# Patient Record
Sex: Female | Born: 1975 | Race: White | Hispanic: No | Marital: Married
Health system: Southern US, Community
[De-identification: ages and names within clinical notes are randomized; demographics above are authoritative.]

---

## 2005-02-16 ENCOUNTER — Other Ambulatory Visit: Admission: RE | Admit: 2005-02-16 | Discharge: 2005-02-16 | Payer: Self-pay | Admitting: Obstetrics & Gynecology

## 2006-12-22 ENCOUNTER — Encounter: Admission: RE | Admit: 2006-12-22 | Discharge: 2007-02-26 | Payer: Self-pay | Admitting: Obstetrics & Gynecology

## 2007-02-27 ENCOUNTER — Inpatient Hospital Stay (HOSPITAL_COMMUNITY): Admission: AD | Admit: 2007-02-27 | Discharge: 2007-02-28 | Payer: Self-pay | Admitting: Obstetrics & Gynecology

## 2014-03-01 ENCOUNTER — Other Ambulatory Visit: Payer: Self-pay | Admitting: Obstetrics & Gynecology

## 2014-03-01 DIAGNOSIS — N63 Unspecified lump in unspecified breast: Secondary | ICD-10-CM

## 2014-03-12 ENCOUNTER — Ambulatory Visit
Admission: RE | Admit: 2014-03-12 | Discharge: 2014-03-12 | Disposition: A | Discharge status: Home / Self Care | Payer: BC Managed Care – PPO | Source: Ambulatory Visit | Attending: Obstetrics & Gynecology | Admitting: Obstetrics & Gynecology

## 2014-03-12 ENCOUNTER — Other Ambulatory Visit: Payer: Self-pay | Admitting: Obstetrics & Gynecology

## 2014-03-12 ENCOUNTER — Encounter (INDEPENDENT_AMBULATORY_CARE_PROVIDER_SITE_OTHER): Payer: Self-pay

## 2014-03-12 DIAGNOSIS — N63 Unspecified lump in unspecified breast: Secondary | ICD-10-CM

## 2014-03-12 DIAGNOSIS — N6320 Unspecified lump in the left breast, unspecified quadrant: Secondary | ICD-10-CM

## 2016-12-23 ENCOUNTER — Other Ambulatory Visit: Payer: Self-pay | Admitting: Obstetrics & Gynecology

## 2016-12-23 DIAGNOSIS — R928 Other abnormal and inconclusive findings on diagnostic imaging of breast: Secondary | ICD-10-CM

## 2016-12-30 ENCOUNTER — Ambulatory Visit
Admission: RE | Admit: 2016-12-30 | Discharge: 2016-12-30 | Disposition: A | Discharge status: Home / Self Care | Payer: BLUE CROSS/BLUE SHIELD | Source: Ambulatory Visit | Attending: Obstetrics & Gynecology | Admitting: Obstetrics & Gynecology

## 2016-12-30 DIAGNOSIS — R928 Other abnormal and inconclusive findings on diagnostic imaging of breast: Secondary | ICD-10-CM

## 2017-02-03 ENCOUNTER — Other Ambulatory Visit: Payer: Self-pay | Admitting: Obstetrics & Gynecology

## 2017-02-03 DIAGNOSIS — N63 Unspecified lump in unspecified breast: Secondary | ICD-10-CM

## 2017-02-10 ENCOUNTER — Other Ambulatory Visit: Payer: Self-pay | Admitting: Obstetrics & Gynecology

## 2017-02-10 DIAGNOSIS — R928 Other abnormal and inconclusive findings on diagnostic imaging of breast: Secondary | ICD-10-CM

## 2017-03-04 ENCOUNTER — Other Ambulatory Visit: Payer: Self-pay | Admitting: Obstetrics & Gynecology

## 2017-03-04 DIAGNOSIS — R928 Other abnormal and inconclusive findings on diagnostic imaging of breast: Secondary | ICD-10-CM

## 2017-03-04 DIAGNOSIS — N6489 Other specified disorders of breast: Secondary | ICD-10-CM

## 2017-03-06 ENCOUNTER — Other Ambulatory Visit: Payer: BLUE CROSS/BLUE SHIELD

## 2017-03-07 ENCOUNTER — Other Ambulatory Visit: Payer: Self-pay | Admitting: Obstetrics & Gynecology

## 2017-03-07 DIAGNOSIS — R928 Other abnormal and inconclusive findings on diagnostic imaging of breast: Secondary | ICD-10-CM

## 2017-03-07 DIAGNOSIS — N6489 Other specified disorders of breast: Secondary | ICD-10-CM

## 2017-03-11 ENCOUNTER — Ambulatory Visit
Admission: RE | Admit: 2017-03-11 | Discharge: 2017-03-11 | Disposition: A | Discharge status: Home / Self Care | Payer: BLUE CROSS/BLUE SHIELD | Source: Ambulatory Visit | Attending: Obstetrics & Gynecology | Admitting: Obstetrics & Gynecology

## 2017-03-11 DIAGNOSIS — N6489 Other specified disorders of breast: Secondary | ICD-10-CM

## 2017-03-11 DIAGNOSIS — R928 Other abnormal and inconclusive findings on diagnostic imaging of breast: Secondary | ICD-10-CM

## 2017-10-31 IMAGING — MG STEREOTACTIC CORE NEEDLE BIOPSY
4 series · 4 of 16 positions shown · non-contrast
Comparison: Previous exams.

ADDENDUM:
Pathology revealed benign breast tissue from the left breast biopsy.
This was found to be concordant by Dr. Niko Simeon.

Pathology results were discussed with the patient by telephone. The
patient reported doing well after the biopsy. Post biopsy
instructions and care were reviewed and questions were answered. The
patient was encouraged to call [REDACTED] for any additional concerns. The patient was instructed to
return for annual screening mammography at [HOSPITAL[facility]
[REDACTED] and informed that a reminder letter would be sent
regarding this appointment.
Pathology results reported by Kaki Jim RN, BSN on 03/14/2017.
CLINICAL DATA: 40-year-old with a developing focal asymmetry in the
upper inner quadrant of the left breast at middle to posterior
depth. Patient denies trauma to the left breast. She states that she
was on long-standing hormone replacement therapy (birth control
pills) which she discontinued approximately 2 months ago, after the
screening mammogram showing the asymmetry.
EXAM:
LEFT BREAST STEREOTACTIC CORE NEEDLE BIOPSY

[L CC]
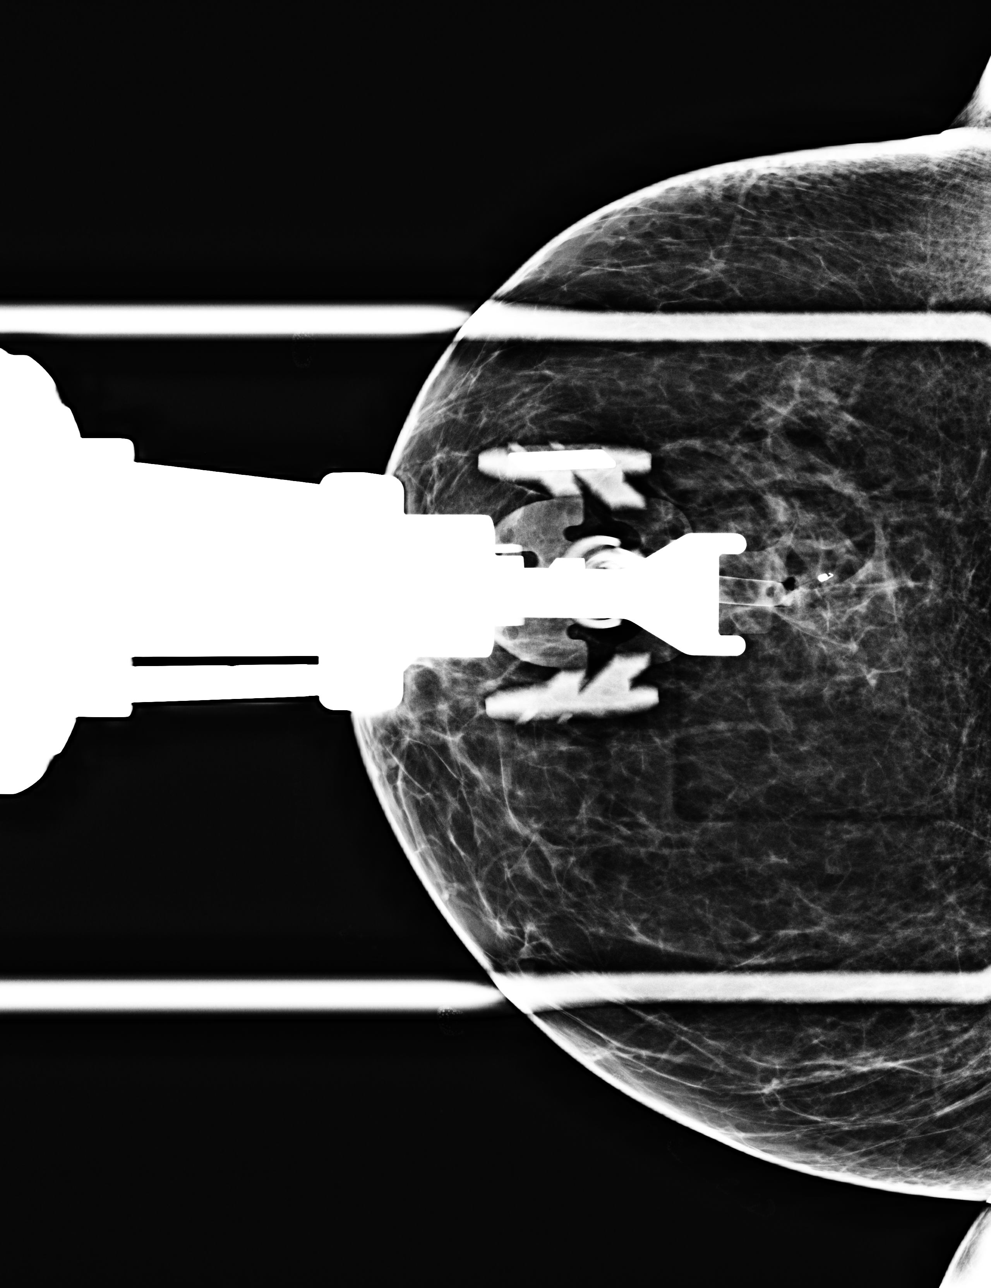

[L CC tomo (1 of 3) · tomo slice 26/51.0]
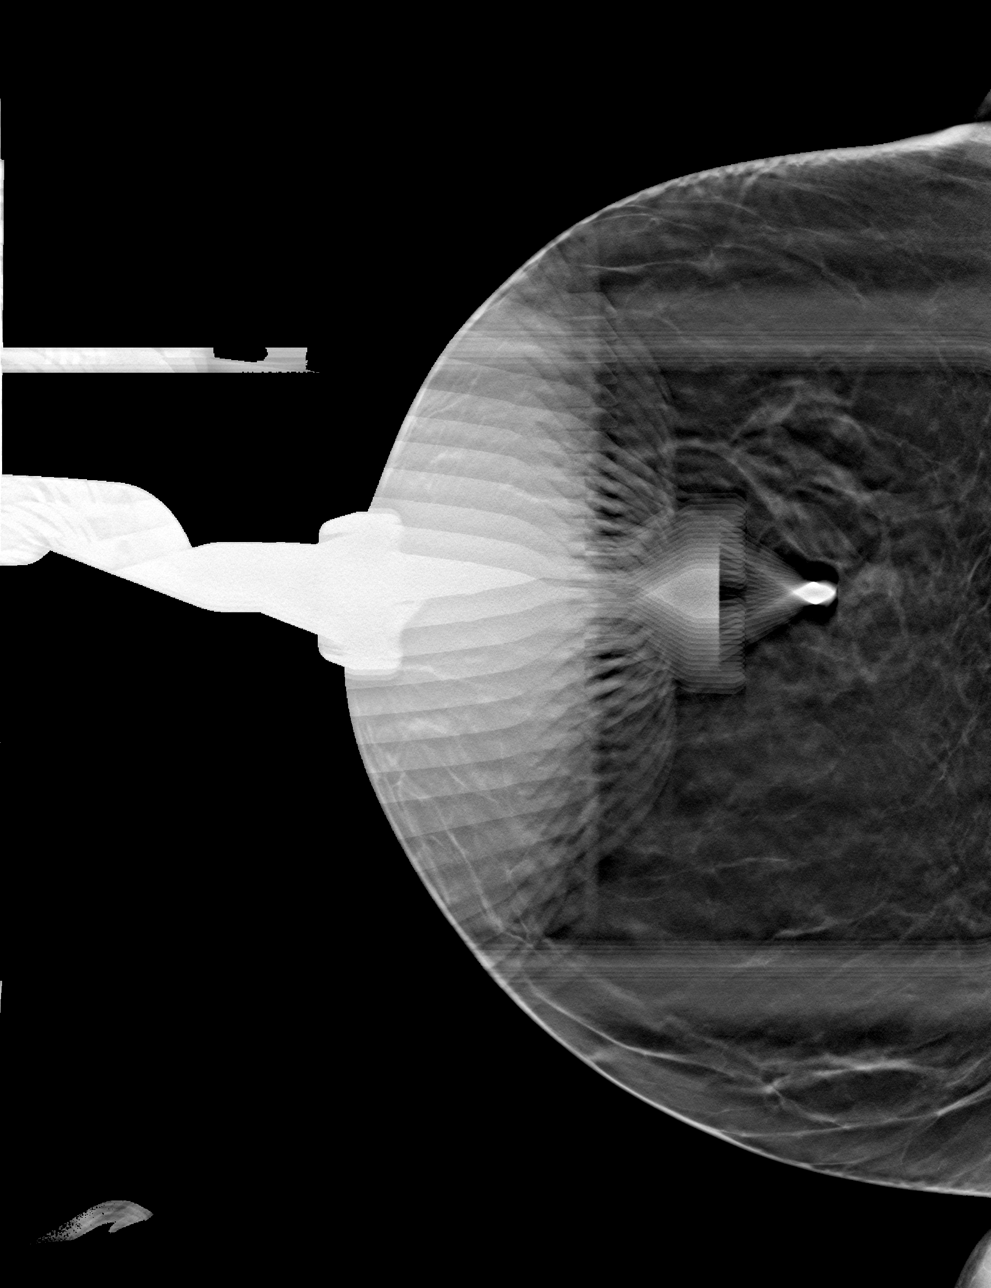

[L CC tomo (2 of 3) · tomo slice 26/51.0]
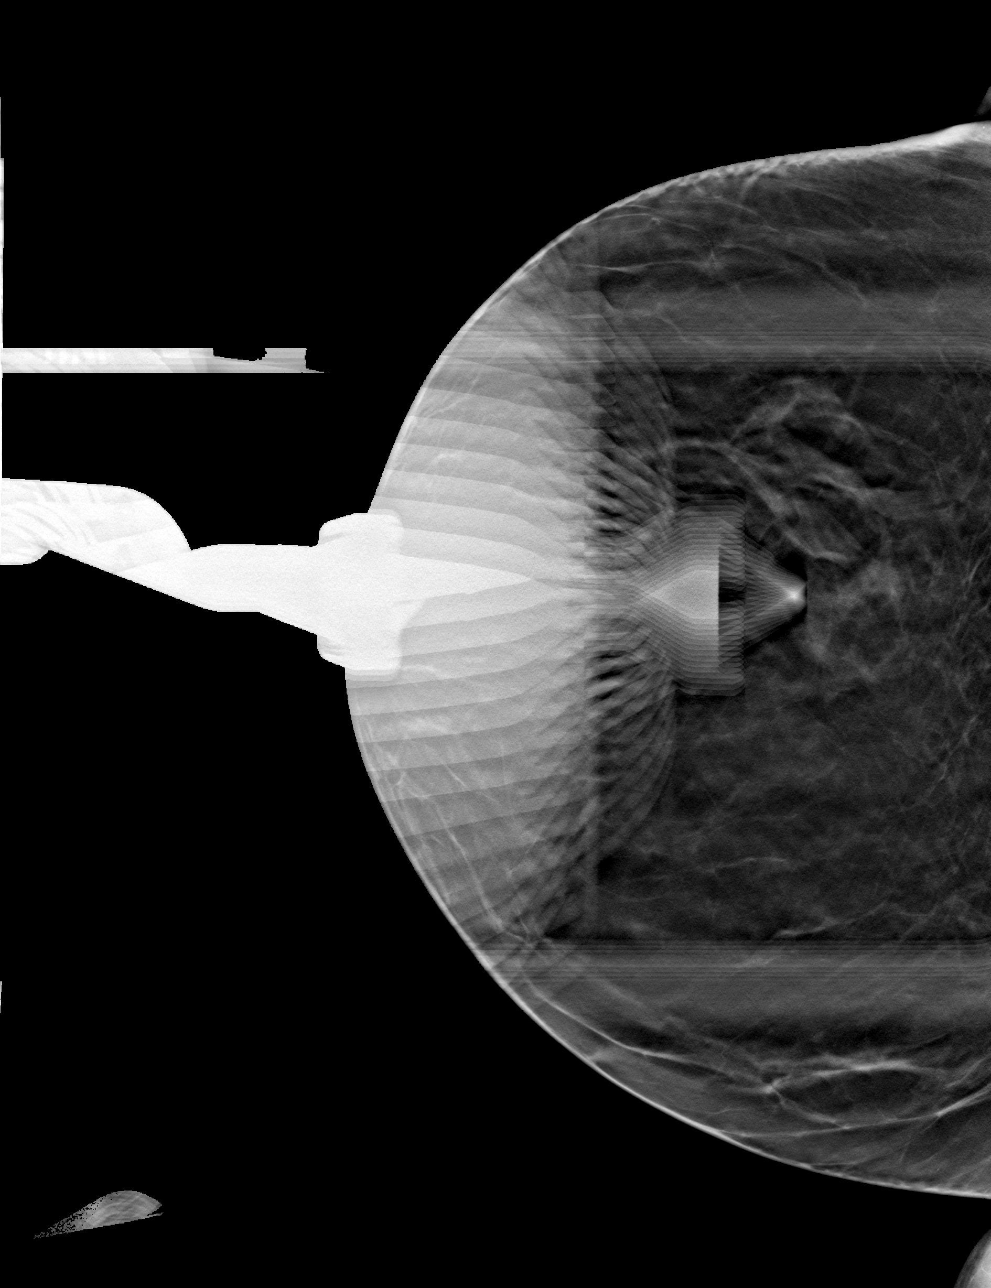

[L CC tomo (3 of 3) · tomo slice 27/52.0]
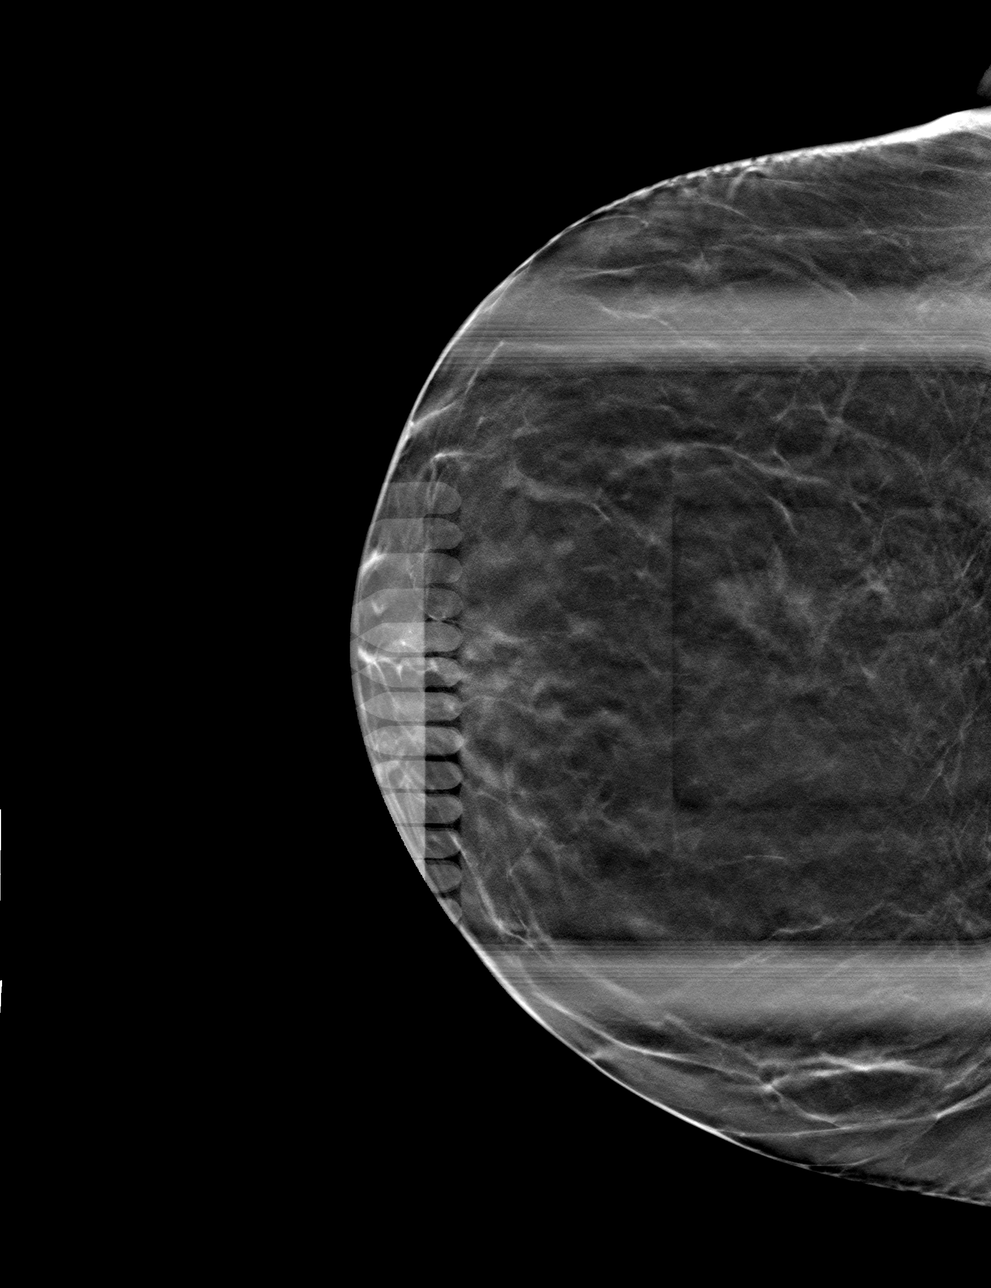

[4 of 16 positions shown; findings below may reference images not displayed]



Using sterile technique with chlorhexidine as skin antisepsis, 1%
lidocaine and 1% lidocaine with epinephrine as local anesthetic,
under stereotactic guidance, a 9 gauge vacuum assisted device was
used to perform core needle biopsy of the developing focal asymmetry
in the upper inner left breast using a superior approach. Specimen
radiograph was performed showing soft tissue density within each of
the specimens. Specimens with soft tissue density are identified for
pathology.

Lesion quadrant: Upper inner quadrant, middle posterior depth.

At the conclusion of the procedure, a coil shaped tissue marker clip
was deployed into the biopsy cavity. Follow-up 2-view mammogram was
performed and dictated separately.
IMPRESSION: Stereotactic-guided biopsy of a developing focal asymmetry in the
upper inner left breast. No apparent complications.

## 2017-10-31 IMAGING — MG MM CLIP PLACEMENT
5 series · 6 of 9 positions shown · non-contrast
Comparison: Previous exam(s).

CLINICAL DATA: Confirmation of clip placement after stereotactic
tomosynthesis core needle biopsy of a developing focal asymmetry in
the upper inner left breast at middle to posterior depth.

EXAM:
3D DIAGNOSTIC LEFT MAMMOGRAM POST STEREOTACTIC BIOPSY

[L CC]
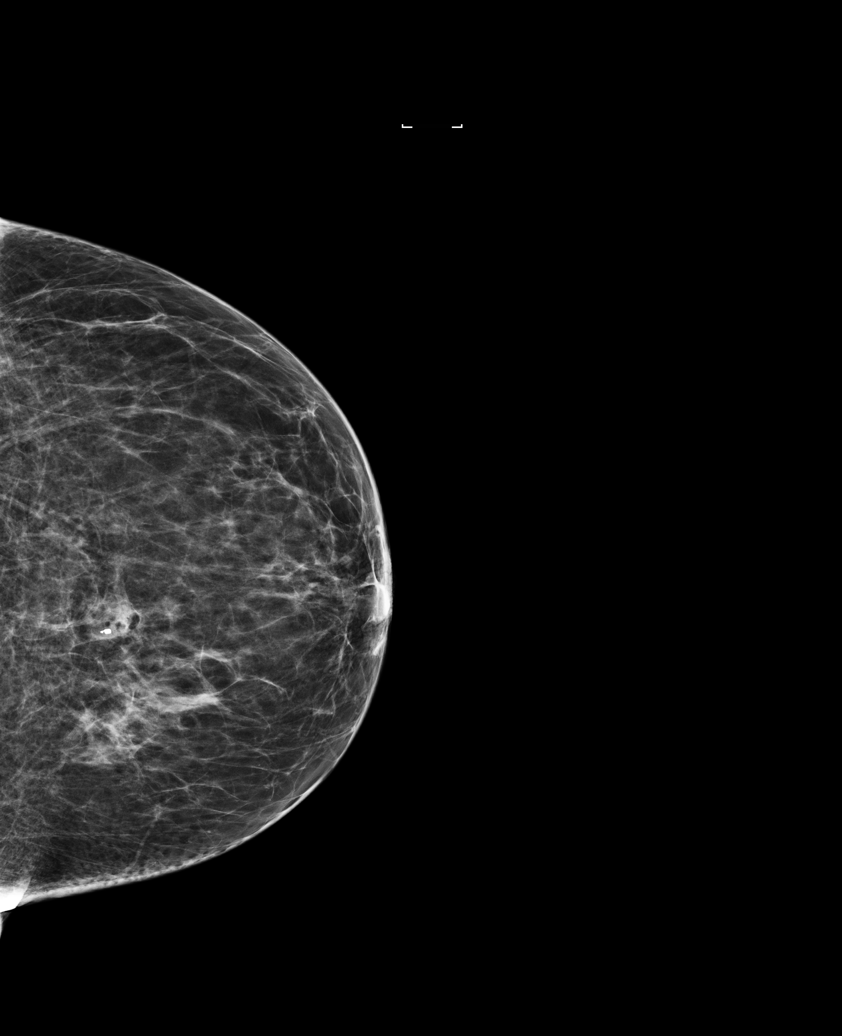

[L CC synth-2D]
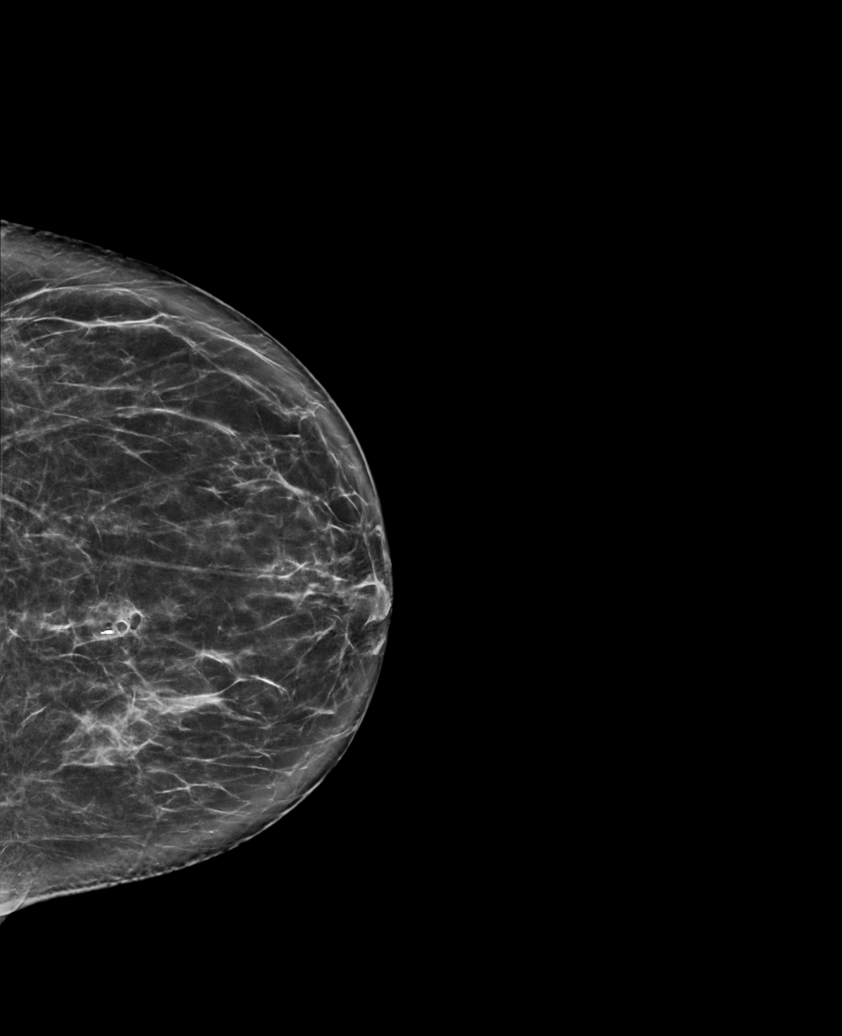

[L ML]
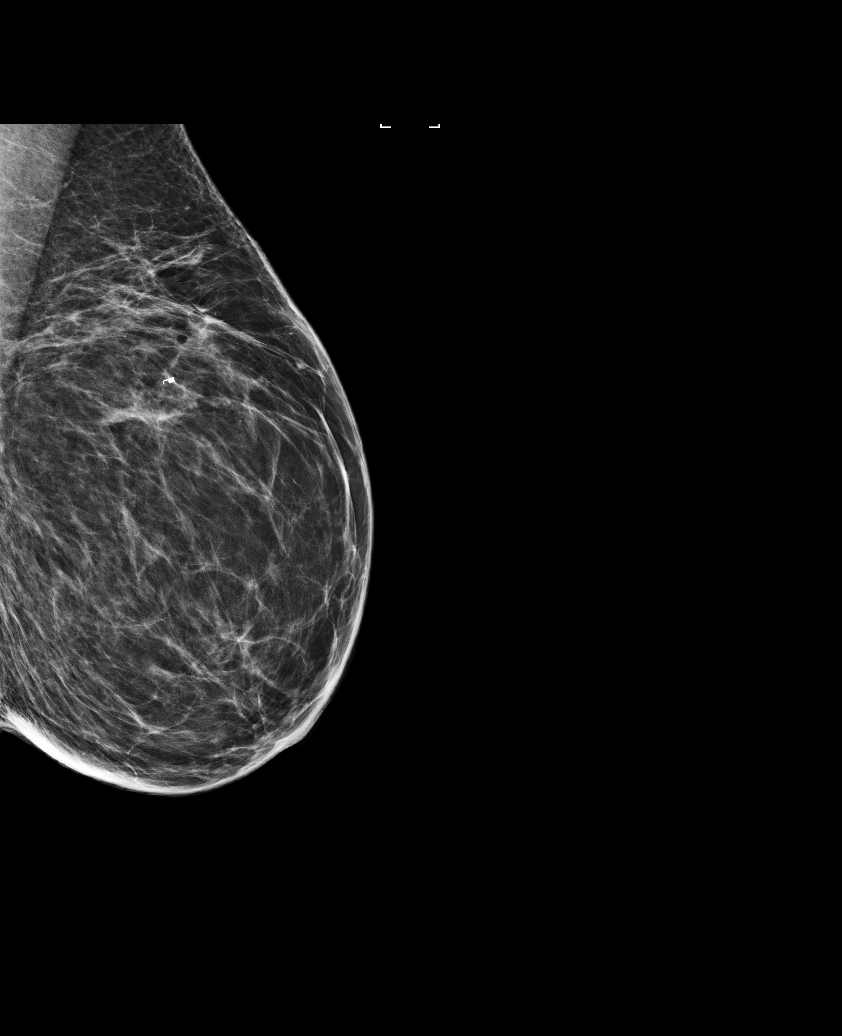

[L ML synth-2D]
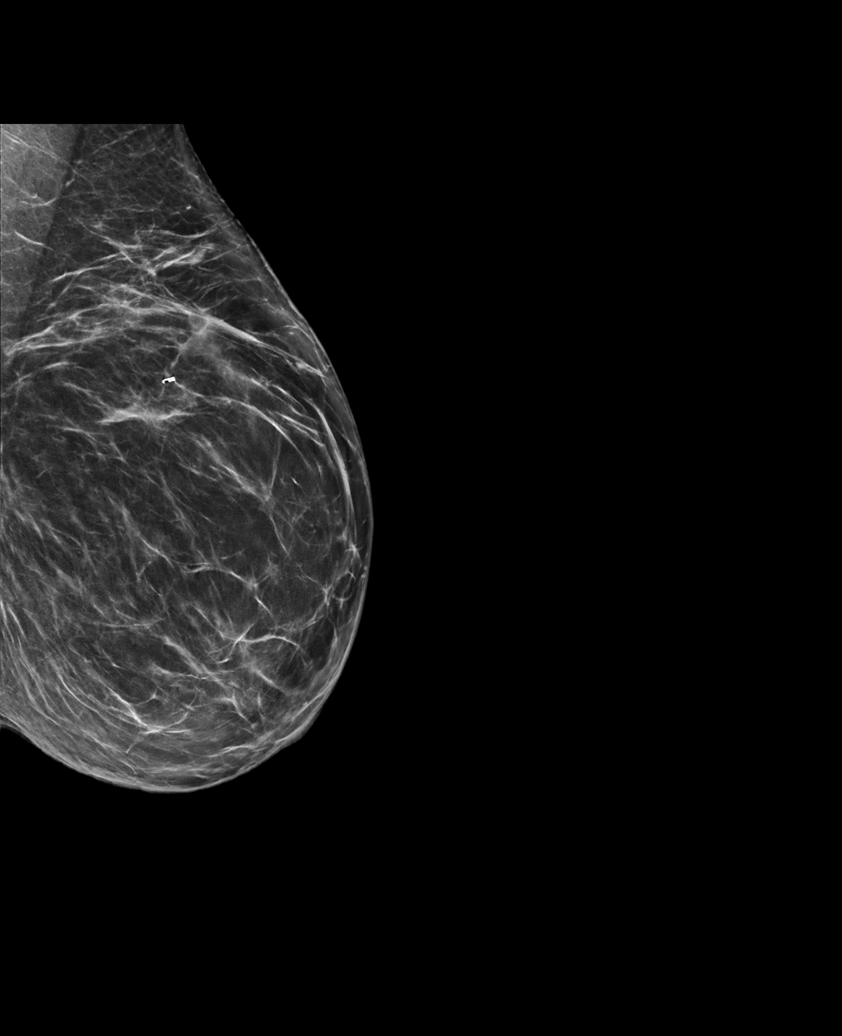

[L ML tomo · 2 of 82 frames shown]
[frame 27/82]
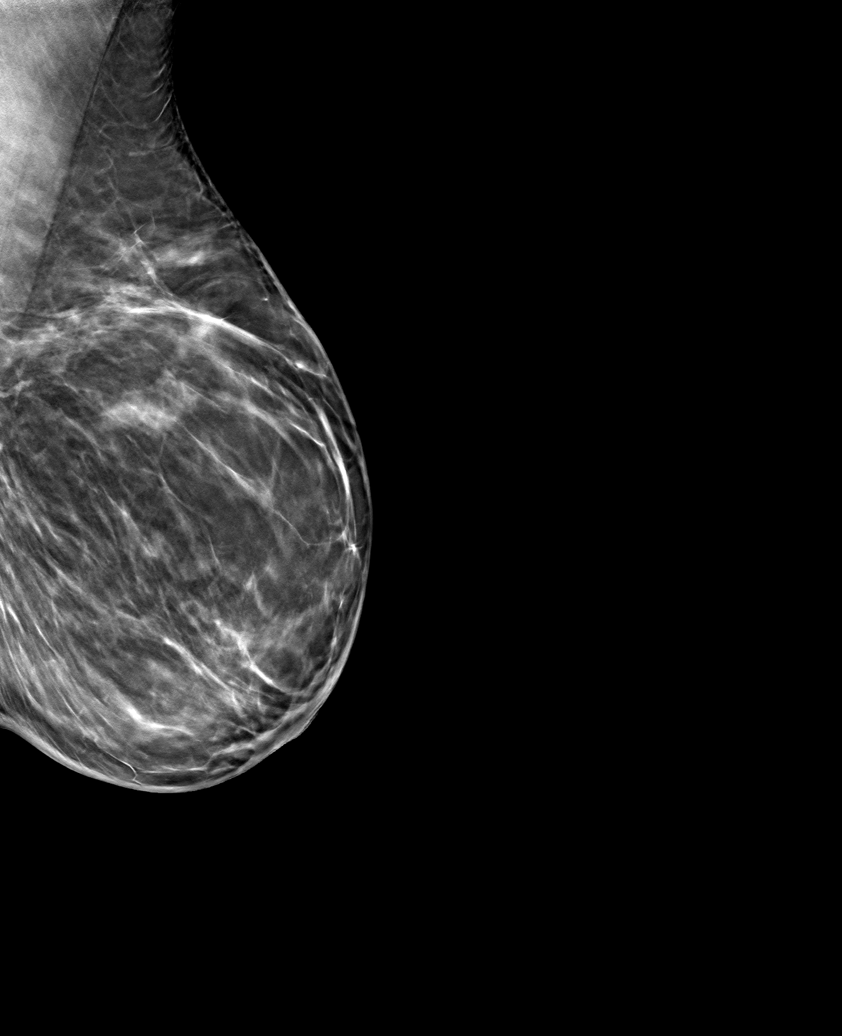
[frame 41/82]
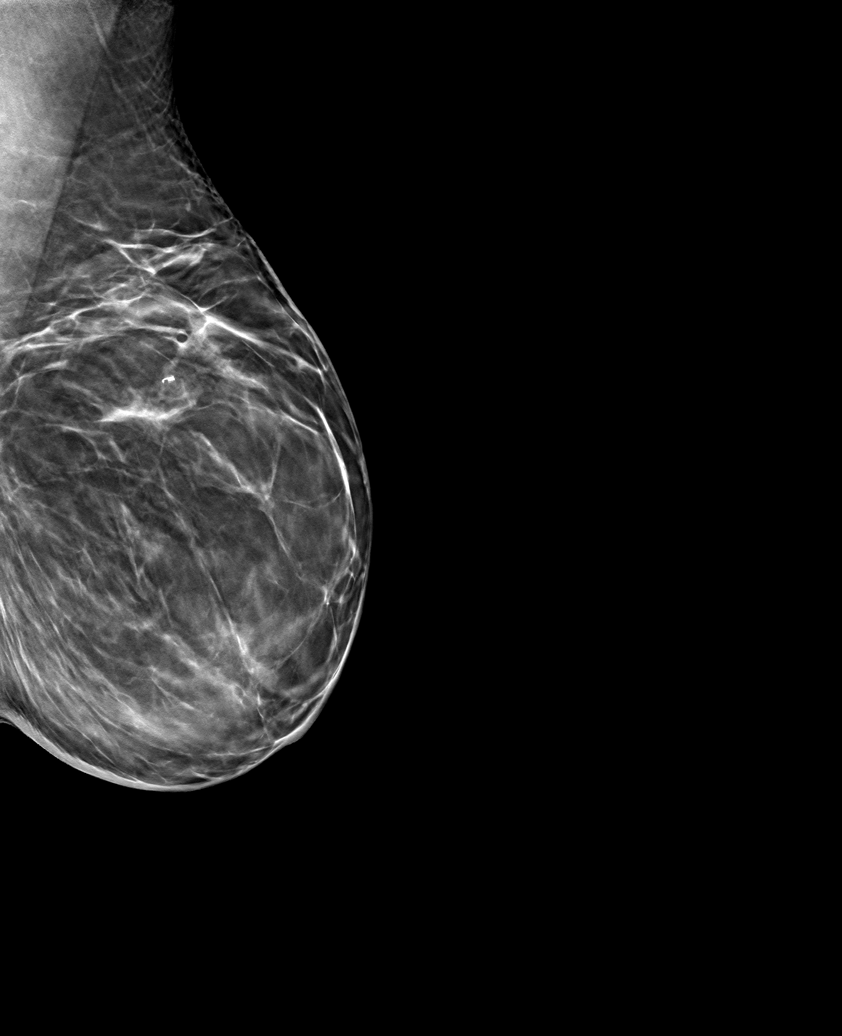

[6 of 9 positions shown; findings below may reference images not displayed]

FINDINGS: 3D mammographic images were obtained following stereotactic
tomosynthesis guided biopsy of a developing asymmetry in the upper
inner left breast. The coil shaped tissue marker clip is
appropriately positioned within the anterior portion of the focal
asymmetry.

Expected post biopsy changes are present without evidence of
hematoma.
IMPRESSION: Appropriate positioning of the coil shaped tissue marker clip within
the anterior portion of the developing focal asymmetry in the upper
inner left breast.

Final Assessment: Post Procedure Mammograms for Marker Placement

## 2021-11-24 DIAGNOSIS — L570 Actinic keratosis: Secondary | ICD-10-CM | POA: Diagnosis not present

## 2021-11-24 DIAGNOSIS — L820 Inflamed seborrheic keratosis: Secondary | ICD-10-CM | POA: Diagnosis not present

## 2022-01-02 DIAGNOSIS — L03011 Cellulitis of right finger: Secondary | ICD-10-CM | POA: Diagnosis not present

## 2022-08-11 DIAGNOSIS — Z131 Encounter for screening for diabetes mellitus: Secondary | ICD-10-CM | POA: Diagnosis not present

## 2022-08-11 DIAGNOSIS — J301 Allergic rhinitis due to pollen: Secondary | ICD-10-CM | POA: Diagnosis not present

## 2022-08-11 DIAGNOSIS — Z0000 Encounter for general adult medical examination without abnormal findings: Secondary | ICD-10-CM | POA: Diagnosis not present

## 2022-08-11 DIAGNOSIS — Z13220 Encounter for screening for lipoid disorders: Secondary | ICD-10-CM | POA: Diagnosis not present

## 2022-08-11 DIAGNOSIS — Z13228 Encounter for screening for other metabolic disorders: Secondary | ICD-10-CM | POA: Diagnosis not present

## 2022-08-11 DIAGNOSIS — I7300 Raynaud's syndrome without gangrene: Secondary | ICD-10-CM | POA: Diagnosis not present

## 2022-08-11 DIAGNOSIS — R000 Tachycardia, unspecified: Secondary | ICD-10-CM | POA: Diagnosis not present

## 2022-08-11 DIAGNOSIS — K219 Gastro-esophageal reflux disease without esophagitis: Secondary | ICD-10-CM | POA: Diagnosis not present

## 2022-08-11 DIAGNOSIS — Z79899 Other long term (current) drug therapy: Secondary | ICD-10-CM | POA: Diagnosis not present

## 2022-08-11 DIAGNOSIS — E782 Mixed hyperlipidemia: Secondary | ICD-10-CM | POA: Diagnosis not present

## 2022-08-11 DIAGNOSIS — E039 Hypothyroidism, unspecified: Secondary | ICD-10-CM | POA: Diagnosis not present

## 2022-09-16 DIAGNOSIS — J40 Bronchitis, not specified as acute or chronic: Secondary | ICD-10-CM | POA: Diagnosis not present

## 2022-09-16 DIAGNOSIS — R000 Tachycardia, unspecified: Secondary | ICD-10-CM | POA: Diagnosis not present

## 2022-09-21 DIAGNOSIS — J40 Bronchitis, not specified as acute or chronic: Secondary | ICD-10-CM | POA: Diagnosis not present

## 2022-11-30 DIAGNOSIS — Z124 Encounter for screening for malignant neoplasm of cervix: Secondary | ICD-10-CM | POA: Diagnosis not present

## 2022-12-14 DIAGNOSIS — R000 Tachycardia, unspecified: Secondary | ICD-10-CM | POA: Diagnosis not present

## 2022-12-15 DIAGNOSIS — I493 Ventricular premature depolarization: Secondary | ICD-10-CM | POA: Diagnosis not present

## 2022-12-15 DIAGNOSIS — I491 Atrial premature depolarization: Secondary | ICD-10-CM | POA: Diagnosis not present

## 2023-08-12 DIAGNOSIS — J301 Allergic rhinitis due to pollen: Secondary | ICD-10-CM | POA: Diagnosis not present

## 2023-08-12 DIAGNOSIS — E039 Hypothyroidism, unspecified: Secondary | ICD-10-CM | POA: Diagnosis not present

## 2023-08-12 DIAGNOSIS — I7300 Raynaud's syndrome without gangrene: Secondary | ICD-10-CM | POA: Diagnosis not present

## 2023-08-12 DIAGNOSIS — R000 Tachycardia, unspecified: Secondary | ICD-10-CM | POA: Diagnosis not present

## 2023-08-12 DIAGNOSIS — Z79899 Other long term (current) drug therapy: Secondary | ICD-10-CM | POA: Diagnosis not present

## 2023-08-12 DIAGNOSIS — H9312 Tinnitus, left ear: Secondary | ICD-10-CM | POA: Diagnosis not present

## 2023-08-12 DIAGNOSIS — Z0000 Encounter for general adult medical examination without abnormal findings: Secondary | ICD-10-CM | POA: Diagnosis not present

## 2023-08-12 DIAGNOSIS — E782 Mixed hyperlipidemia: Secondary | ICD-10-CM | POA: Diagnosis not present

## 2023-08-12 DIAGNOSIS — J40 Bronchitis, not specified as acute or chronic: Secondary | ICD-10-CM | POA: Diagnosis not present

## 2023-08-12 DIAGNOSIS — K219 Gastro-esophageal reflux disease without esophagitis: Secondary | ICD-10-CM | POA: Diagnosis not present

## 2024-05-31 DIAGNOSIS — J301 Allergic rhinitis due to pollen: Secondary | ICD-10-CM | POA: Diagnosis not present

## 2024-05-31 DIAGNOSIS — E782 Mixed hyperlipidemia: Secondary | ICD-10-CM | POA: Diagnosis not present

## 2024-05-31 DIAGNOSIS — E039 Hypothyroidism, unspecified: Secondary | ICD-10-CM | POA: Diagnosis not present

## 2024-05-31 DIAGNOSIS — I7300 Raynaud's syndrome without gangrene: Secondary | ICD-10-CM | POA: Diagnosis not present

## 2024-05-31 DIAGNOSIS — Z79899 Other long term (current) drug therapy: Secondary | ICD-10-CM | POA: Diagnosis not present

## 2024-05-31 DIAGNOSIS — R000 Tachycardia, unspecified: Secondary | ICD-10-CM | POA: Diagnosis not present

## 2024-05-31 DIAGNOSIS — R7303 Prediabetes: Secondary | ICD-10-CM | POA: Diagnosis not present

## 2024-05-31 DIAGNOSIS — K219 Gastro-esophageal reflux disease without esophagitis: Secondary | ICD-10-CM | POA: Diagnosis not present

## 2024-06-27 DIAGNOSIS — R1030 Lower abdominal pain, unspecified: Secondary | ICD-10-CM | POA: Diagnosis not present

## 2024-06-27 DIAGNOSIS — Z1231 Encounter for screening mammogram for malignant neoplasm of breast: Secondary | ICD-10-CM | POA: Diagnosis not present

## 2024-06-27 DIAGNOSIS — Z01419 Encounter for gynecological examination (general) (routine) without abnormal findings: Secondary | ICD-10-CM | POA: Diagnosis not present
